# Patient Record
Sex: Female | Born: 1990 | Hispanic: No | Marital: Married | Smoking: Never smoker
Health system: Southern US, Community
[De-identification: ages and names within clinical notes are randomized; demographics above are authoritative.]

---

## 2015-01-25 ENCOUNTER — Emergency Department (HOSPITAL_COMMUNITY)
Admission: EM | Admit: 2015-01-25 | Discharge: 2015-01-25 | Disposition: A | Discharge status: Home / Self Care | Payer: Medicaid Other | Source: Home / Self Care

## 2015-01-25 ENCOUNTER — Ambulatory Visit (INDEPENDENT_AMBULATORY_CARE_PROVIDER_SITE_OTHER): Payer: Medicaid Other | Admitting: Family Medicine

## 2015-01-25 VITALS — BP 111/65 | HR 88 | Temp 98.2°F | Ht 58.75 in | Wt 150.0 lb

## 2015-01-25 DIAGNOSIS — J4520 Mild intermittent asthma, uncomplicated: Secondary | ICD-10-CM | POA: Diagnosis not present

## 2015-01-25 DIAGNOSIS — Z23 Encounter for immunization: Secondary | ICD-10-CM | POA: Diagnosis present

## 2015-01-25 MED ORDER — ALBUTEROL SULFATE HFA 108 (90 BASE) MCG/ACT IN AERS: 2.0000 | INHALATION_SPRAY | Freq: Four times a day (QID) | RESPIRATORY_TRACT | Status: DC | PRN

## 2015-01-25 NOTE — Patient Instructions (Signed)

## 2015-01-25 NOTE — Progress Notes (Signed)
Win Khine interpreter utilized during today's visit.  [facility] New [facility] Visit  HPI:  [facility] today for a new patient appointment to establish general primary care, also to discuss asthma.  ROS: See HPI  Immigrant Social History: - Date arrived in [place]: Dec 16th - Country of origin: [place] - Location of refugee camp (if applicable), how long there, and what caused patient to leave home country?: [place], poor living conditions - Primary language: Burmese and mon  -Licensed conveyancerequires intepreter (essentially speaks no [place]) - Education: Highest level of education: 4th grade - Prior work: plantation/farming - Best contact name and number: self, [phone] - Tobacco/alcohol/drug use: none - Marriage Status: married - Sexual activity: yes - Were you beaten or tortured in your country or refugee camp?  no   Preventative Care History: -Seen at health department?: yes -records not yet available  Past Medical Hx:  -asthma  Past Surgical Hx:  -none  Family Hx: updated in Epic - Number of family members:  Parents, siblings - Number of family members in [place]:  2  PHYSICAL EXAM: BP 111/65 mmHg  Pulse 88  Temp(Src) 98.2 F (36.8 C) (Oral)  Ht 4' 10.75" (1.492 m)  Wt 150 lb (68.04 kg)  BMI 30.57 kg/m2 Gen: well-appearing young woman HEENT: MMM, NCAT, PERRL, TMs clear Neck:  supple Heart: RRR, no MRG Lungs: CTAB, normal WOB Abdomen: soft, mildly tender in lower quadrants, no rebound or guarding Skin:  No rashes MSK: normal gait and ROM Neuro: alert and oriented, no focal deficits noted Psych: normal affect and behavior  Examined and interviewed with Dr. Gwendolyn GrantWalden  FOLLOW UP: F/u in 1 month for abdominal pain

## 2015-01-26 ENCOUNTER — Encounter: Payer: Self-pay | Admitting: Family Medicine

## 2015-01-26 NOTE — Assessment & Plan Note (Signed)
Chest tightness and SOB since childhood, better since age 24, never used meds besides cough syrup, still with nighttime cough and occasional dyspnea/chest tightness - MDI teaching with pharmacist today - start prn albuterol - f/u in 1 month

## 2015-02-04 ENCOUNTER — Encounter: Payer: Self-pay | Admitting: Family Medicine

## 2015-02-04 NOTE — Progress Notes (Signed)
[facility] positive for Entamoeba Polecki.  Contacted [facility] -- patient without symptoms and parasite deemed to be non-pathogenic and therefore no need for treatment.  Placed contact info from HD in "to be scanned" file.

## 2015-03-24 ENCOUNTER — Ambulatory Visit (INDEPENDENT_AMBULATORY_CARE_PROVIDER_SITE_OTHER): Payer: Medicaid Other | Admitting: Family Medicine

## 2015-03-24 VITALS — BP 109/78 | HR 75 | Temp 98.7°F | Wt 145.1 lb

## 2015-03-24 DIAGNOSIS — R200 Anesthesia of skin: Secondary | ICD-10-CM

## 2015-03-24 DIAGNOSIS — R208 Other disturbances of skin sensation: Secondary | ICD-10-CM | POA: Diagnosis not present

## 2015-03-24 DIAGNOSIS — R300 Dysuria: Secondary | ICD-10-CM

## 2015-03-24 DIAGNOSIS — R29898 Other symptoms and signs involving the musculoskeletal system: Secondary | ICD-10-CM

## 2015-03-24 LAB — POCT URINALYSIS DIPSTICK
Bilirubin, UA: NEGATIVE
Glucose, UA: NEGATIVE
Ketones, UA: NEGATIVE
LEUKOCYTES UA: NEGATIVE
Nitrite, UA: NEGATIVE
PROTEIN UA: NEGATIVE
Spec Grav, UA: 1.025
UROBILINOGEN UA: 0.2
pH, UA: 6.5

## 2015-03-24 LAB — POCT UA - MICROSCOPIC ONLY

## 2015-03-24 NOTE — Patient Instructions (Addendum)
We will get an MRI (image) of your head and neck to look for any signs of neurological disease.   In the meantime you can try to take any over the counter medicines to help with the small amount of pain you are having, tylenol 500mg  every 6 hours or ibuprofen 600mg  every 6 hours.  Work on grip strength exercises throughout the day as demonstrated in our visit.  Schedule a follow up visit 1-2 weeks after your MRI

## 2015-03-24 NOTE — Assessment & Plan Note (Signed)
2 weeks symptoms, has hx of UTI in [place]. Negative UA in clinic today. Unable to do wet prep as sx may be due to yeast with c/o itching as well. F/u next few weeks for GU exam.

## 2015-03-24 NOTE — Assessment & Plan Note (Signed)
Concerning onset and symptoms for neurological issue. On exam pt demonstrates weakness of both UE to the point of dropping her whole upper arm during testing. No LE weakness, no recent vaccinations, no other neurologic deficits noted. Case discussed with Dr. Randolm Idol, we deem it warranted to get MRI brain/c-spine to evaluate for neurological disease like MS, other demyelinating dz. F/u after MRI

## 2015-03-24 NOTE — Progress Notes (Signed)
   Subjective:    Patient ID: Joyce Nichols, female    DOB: 1991/02/27, 24 y.o.   MRN: [id number]  HPI  Patient presents for Same Day Appointment  CC: upper arm numbness and weakness  # Bilateral UE numbness/weakness:  Started 1 month ago  No history of similar complaints, no history of trauma  Both arms feel numb and weak, right side symptoms worse  Primarily feels numbness, but also has pain. Does not state in specific areas of the arm/hand, goes from shoulders down to fingertips. After some discussion sounds like it affects palmar surface of hands moreso, all fingers.  No recent changes that she can think of, no new medicines, no changes in activity  Symptoms causing significant issue with driving ROS: no changes in vision, no changes in hearing, no HA, no trouble swallowing, no CP, no SOB, no fevers/chills. +burning with urination  # Burning with urination  Present for the past few weeks, no increased frequency  Having itching as well  No abdominal pain  Review of Systems   See HPI for ROS. All other systems reviewed and are negative.  Past medical history, surgical, family, and social history reviewed and updated in the EMR as appropriate.  Objective:  BP 109/78 mmHg  Pulse 75  Temp(Src) 98.7 F (37.1 C) (Oral)  Wt 145 lb 2 oz (65.828 kg)  LMP 02/04/2015 (Approximate) Vitals and nursing note reviewed  General: NAD Neck: no tenderness to palpation. FROM with no pain.  Ext: no edema or deformity noted Skin: no rashes or lesions noted Neuro: alert and oriented. CN2-12 normal. Grip strength 5/5 bilaterally. Finger abduction/adduction strength testing 4/5 bilaterally, wrist flexion/extension 4/5 bilaterally. Pt lets hands and arms go limp into her lap between testing. 2-3+ biceps reflexes bilaterally, 2+ triceps, 2+ brachioradialis, 2+ patellar bilaterally. Phalen's positive for symptoms both sides but not specifically median distribution, Tinel's same, positive for  tingling all fingers bilaterally. Psych: endorses depression x 1 month since symptoms started, no other complaints prior  Assessment & Plan:  See Problem List Documentation

## 2015-04-02 ENCOUNTER — Ambulatory Visit (HOSPITAL_COMMUNITY): Admission: RE | Admit: 2015-04-02 | Payer: Medicaid Other | Source: Ambulatory Visit

## 2015-04-02 ENCOUNTER — Ambulatory Visit (HOSPITAL_COMMUNITY)
Admission: RE | Admit: 2015-04-02 | Discharge: 2015-04-02 | Disposition: A | Discharge status: Home / Self Care | Payer: Medicaid Other | Source: Ambulatory Visit | Attending: Family Medicine | Admitting: Family Medicine

## 2015-04-02 DIAGNOSIS — R531 Weakness: Secondary | ICD-10-CM | POA: Diagnosis not present

## 2015-04-02 DIAGNOSIS — R200 Anesthesia of skin: Secondary | ICD-10-CM | POA: Diagnosis not present

## 2015-04-02 DIAGNOSIS — R29898 Other symptoms and signs involving the musculoskeletal system: Secondary | ICD-10-CM

## 2015-04-07 ENCOUNTER — Encounter: Payer: Self-pay | Admitting: Family Medicine

## 2015-04-07 ENCOUNTER — Ambulatory Visit (INDEPENDENT_AMBULATORY_CARE_PROVIDER_SITE_OTHER): Payer: Medicaid Other | Admitting: Family Medicine

## 2015-04-07 VITALS — BP 104/63 | HR 63 | Temp 98.2°F | Ht 59.0 in | Wt 144.0 lb

## 2015-04-07 DIAGNOSIS — R202 Paresthesia of skin: Secondary | ICD-10-CM

## 2015-04-07 DIAGNOSIS — R29898 Other symptoms and signs involving the musculoskeletal system: Secondary | ICD-10-CM | POA: Diagnosis not present

## 2015-04-07 DIAGNOSIS — R200 Anesthesia of skin: Secondary | ICD-10-CM

## 2015-04-07 LAB — BASIC METABOLIC PANEL WITH GFR
BUN: 8 mg/dL (ref 6–23)
CO2: 23 mEq/L (ref 19–32)
Calcium: 9 mg/dL (ref 8.4–10.5)
Chloride: 105 mEq/L (ref 96–112)
Creat: 0.56 mg/dL (ref 0.50–1.10)
GFR, Est African American: >89 mL/min
GFR, Est Non African American: >89 mL/min
Glucose, Bld: 79 mg/dL (ref 70–99)
Potassium: 4.1 mEq/L (ref 3.5–5.3)
Sodium: 141 mEq/L (ref 135–145)

## 2015-04-07 LAB — CBC
HCT: 39.1 % (ref 36.0–46.0)
Hemoglobin: 12.9 g/dL (ref 12.0–15.0)
MCH: 29.4 pg (ref 26.0–34.0)
MCHC: 33 g/dL (ref 30.0–36.0)
MCV: 89.1 fL (ref 78.0–100.0)
MPV: 10.3 fL (ref 8.6–12.4)
Platelets: 273 10*3/uL (ref 150–400)
RBC: 4.39 MIL/uL (ref 3.87–5.11)
RDW: 13.6 % (ref 11.5–15.5)
WBC: 7.5 10*3/uL (ref 4.0–10.5)

## 2015-04-07 NOTE — Assessment & Plan Note (Signed)
MRI c-spine/brain were normal. Pt today reports symptoms are entirely on the right (different from last visit where she stated she was having issues with the left as well). No specific isolated nerve distribution by history. Discussed NCS and pt desires to get this. Will also check CBC and Bmet. F/u after NCS (~6 weeks).

## 2015-04-07 NOTE — Patient Instructions (Addendum)
We will check your blood work for signs of anemia (low red blood cell count).  We will get a nerve conduction test to look at the nerves of the right arm. This is done at another doctor's office (neurologist)

## 2015-04-07 NOTE — Progress Notes (Signed)
   Subjective:    Patient ID: Joyce Nichols, female    DOB: 08/20/1991, 24 y.o.   MRN: [id number]  HPI  CC: arm weakness/numbness  # Upper extremity weakness/numbness:  Had normal MRI c-spine and brain (did show possible evidence of anemia)  Pt symptoms have stayed the same  Today says it is entirely her right arm/hand that is affected, says her left side is completely normal  Feels weak of the hand and arm  Numbness covers all of the fingers, both palm and back of hand  Review of Systems   See HPI for ROS. All other systems reviewed and are negative.  Past medical history, surgical, family, and social history reviewed and updated in the EMR as appropriate. Objective:  BP 104/63 mmHg  Pulse 63  Temp(Src) 98.2 F (36.8 C) (Oral)  Ht 4\' 11"  (1.499 m)  Wt 144 lb (65.318 kg)  BMI 29.07 kg/m2  LMP 02/04/2015 (Approximate) Vitals and nursing note reviewed  General: NAD Neuro: alert and oriented x 3. Strength testing: 5/5 grip biceps, triceps bilaterally. Phalen's and tinel's are equivocal (says right hand/fingers are numb but not made worse with testing) MSK: no deformity of right UE, fingers are normal in appearance.   Assessment & Plan:  See Problem List Documentation

## 2015-04-14 ENCOUNTER — Ambulatory Visit (INDEPENDENT_AMBULATORY_CARE_PROVIDER_SITE_OTHER): Payer: Medicaid Other | Admitting: Family Medicine

## 2015-04-14 ENCOUNTER — Encounter: Payer: Self-pay | Admitting: Family Medicine

## 2015-04-14 VITALS — BP 106/65 | HR 84 | Temp 98.7°F | Ht 59.0 in | Wt 144.6 lb

## 2015-04-14 DIAGNOSIS — R29898 Other symptoms and signs involving the musculoskeletal system: Secondary | ICD-10-CM

## 2015-04-14 DIAGNOSIS — H5310 Unspecified subjective visual disturbances: Secondary | ICD-10-CM

## 2015-04-14 DIAGNOSIS — H53143 Visual discomfort, bilateral: Secondary | ICD-10-CM | POA: Diagnosis not present

## 2015-04-14 NOTE — Assessment & Plan Note (Signed)
Following up with Neurology next week for nerve conduction study

## 2015-04-14 NOTE — Patient Instructions (Signed)
Thank you so much for coming to visit me today!  Unfortunately, I cannot get you in to see an eye doctor with a referral. I recommend [facility].  Please let us know if there is anything else we can do for your!  Please keep your appointment with the Neurologist next week.  Thanks again! Dr. Caroleen Hammanumley

## 2015-04-14 NOTE — Assessment & Plan Note (Signed)
-   Suspect pain is due to eye fatigue that occurs when focusing during driving or reading - Eye pain stable x2 years and only occurs with reading/driving. Do not suspect emergent etiology at this time. - Eye exam normal, vision screen normal - Will not refer to ophthalmology at this time. Recommended going to eye clinic in [facility] for more in depth evaluation of sight.

## 2015-04-14 NOTE — Progress Notes (Signed)
Subjective:     Patient ID: Joyce Nichols, female   DOB: 09/21/1991, 24 y.o.   MRN: [id number]  HPI Mrs. Joyce Nichols is a 24yo female presenting today for eye pain. Initially made visit to evaluate vaginal itching, however she states this has resolved. Visit conducted with aid of interpretor.  # Vaginal Itching: - Resolved x1 week - Denies discharge, vaginal pain, abdominal pain, vaginal bleeding - Up to date on Pap Smear. [facility].  # Arm Numbness: - Follows up with Neurology next week for nerve conduction study - Unchanged from last visit  # Eye Pain: - Occurs when driving or reading - Bilateral - States it feels like someone is squeezing her eyeball - Denies blurred vision, redness, itchiness, discharge, photophobia - Occasionally reports watery eyes - Has been stable x2 years - Has never had this evaluated by PCP or ophthalmologist - Would like to be seen by ophthalmologist if possible  Review of Systems  HENT: Negative for sinus pressure.   Eyes: Positive for pain. Negative for photophobia, discharge, redness, itching and visual disturbance.  Genitourinary: Negative for dysuria, frequency, flank pain, vaginal bleeding, vaginal discharge, vaginal pain and pelvic pain.  Neurological: Positive for numbness.       Objective:   Physical Exam  Constitutional: She appears well-developed and well-nourished. No distress.  Eyes: Conjunctivae and EOM are normal. Pupils are equal, round, and reactive to light. Right eye exhibits no discharge. Left eye exhibits no discharge.  No redness noted. Ophthalmologic exam normal.  Cardiovascular: Normal rate and regular rhythm.  Exam reveals no gallop and no friction rub.   No murmur heard. Pulmonary/Chest: Effort normal. No respiratory distress. She has no wheezes. She has no rales.  Abdominal: Soft. She exhibits no distension. There is no tenderness.  Musculoskeletal: She exhibits no edema.       Assessment:     Please refer to Problem List for Assessment.     Plan:     Please refer to Problem List for Plan.

## 2015-04-20 ENCOUNTER — Encounter: Payer: Self-pay | Admitting: Neurology

## 2015-04-20 ENCOUNTER — Ambulatory Visit (INDEPENDENT_AMBULATORY_CARE_PROVIDER_SITE_OTHER): Payer: Medicaid Other | Admitting: Neurology

## 2015-04-20 ENCOUNTER — Ambulatory Visit (INDEPENDENT_AMBULATORY_CARE_PROVIDER_SITE_OTHER): Payer: Self-pay | Admitting: Neurology

## 2015-04-20 DIAGNOSIS — R29898 Other symptoms and signs involving the musculoskeletal system: Secondary | ICD-10-CM

## 2015-04-20 DIAGNOSIS — R202 Paresthesia of skin: Secondary | ICD-10-CM | POA: Diagnosis not present

## 2015-04-20 DIAGNOSIS — R200 Anesthesia of skin: Secondary | ICD-10-CM

## 2015-04-20 NOTE — Progress Notes (Signed)
Please refer to EMG and nerve conduction study procedure note. 

## 2015-04-20 NOTE — Procedures (Signed)
     HISTORY:  Joyce Nichols is a 24 year old Asian female with a two-month history of numbness and some weakness involving the right hand. She is being evaluated for possible neuropathy or a cervical radiculopathy. She denies any neck or shoulder discomfort.  NERVE CONDUCTION STUDIES:  Nerve conduction studies were performed on both upper extremities. The distal motor latencies and motor amplitudes for the median and ulnar nerves were within normal limits. The F wave latencies and nerve conduction velocities for these nerves were also normal. The sensory latencies for the median and ulnar nerves were normal.   EMG STUDIES:  EMG study was performed on the right upper extremity:  The first dorsal interosseous muscle reveals 2 to 4 K units with full recruitment. No fibrillations or positive waves were noted. The abductor pollicis brevis muscle reveals 2 to 4 K units with full recruitment. No fibrillations or positive waves were noted. The extensor indicis proprius muscle reveals 1 to 3 K units with full recruitment. No fibrillations or positive waves were noted. The pronator teres muscle reveals 2 to 3 K units with full recruitment. No fibrillations or positive waves were noted. The biceps muscle reveals 1 to 2 K units with full recruitment. No fibrillations or positive waves were noted. The triceps muscle reveals 2 to 4 K units with full recruitment. No fibrillations or positive waves were noted. The anterior deltoid muscle reveals 2 to 3 K units with full recruitment. No fibrillations or positive waves were noted.  The patient refused evaluation of the cervical paraspinal muscles.   IMPRESSION:  Nerve conduction studies done on both upper extremities were within normal limits. No evidence of a neuropathy is seen. EMG evaluation of the right upper extremity was unremarkable, no evidence of an overlying cervical radiculopathy was seen.  Marlan Palau. Keith Willis MD 04/20/2015 1:11 PM  [facility] [address] [place], [place] [id number]  Phone [phone] Fax [phone]

## 2015-06-24 ENCOUNTER — Encounter: Payer: Self-pay | Admitting: Family Medicine

## 2015-06-24 ENCOUNTER — Ambulatory Visit (INDEPENDENT_AMBULATORY_CARE_PROVIDER_SITE_OTHER): Payer: Medicaid Other | Admitting: Family Medicine

## 2015-06-24 VITALS — BP 115/69 | HR 78 | Temp 98.3°F | Wt 145.3 lb

## 2015-06-24 DIAGNOSIS — M545 Low back pain, unspecified: Secondary | ICD-10-CM | POA: Insufficient documentation

## 2015-06-24 DIAGNOSIS — Z23 Encounter for immunization: Secondary | ICD-10-CM | POA: Diagnosis not present

## 2015-06-24 MED ORDER — CYCLOBENZAPRINE HCL 5 MG PO TABS: 5.0000 mg | ORAL_TABLET | Freq: Three times a day (TID) | ORAL | Status: DC | PRN

## 2015-06-24 MED ORDER — MELOXICAM 7.5 MG PO TABS: 7.5000 mg | ORAL_TABLET | Freq: Every day | ORAL | Status: DC

## 2015-06-24 NOTE — Patient Instructions (Signed)
Thank you for coming to the clinic today. It was nice seeing you.  You probably have a muscle strain of your lower back. This usually improves in 4-6 weeks. We will give you two medications today. Please take the Mobic once a day for the next 2 weeks. Please use the Flexeril as needed for back pain.  Please return to the clinic in 2-3 weeks if your symptoms are worsening or not improving.  Take Care,  Dr Jimmey Ralph  Back Pain, Adult Back pain is very common. The pain often gets better over time. The cause of back pain is usually not dangerous. Most people can learn to manage their back pain on their own.  HOME CARE   Stay active. Start with short walks on flat ground if you can. Try to walk farther each day.  Do not sit, drive, or stand in one place for more than 30 minutes. Do not stay in bed.  Do not avoid exercise or work. Activity can help your back heal faster.  Be careful when you bend or lift an object. Bend at your knees, keep the object close to you, and do not twist.  Sleep on a firm mattress. Lie on your side, and bend your knees. If you lie on your back, put a pillow under your knees.  Only take medicines as told by your doctor.  Put ice on the injured area.  Put ice in a plastic bag.  Place a towel between your skin and the bag.  Leave the ice on for 15-20 minutes, 03-04 times a day for the first 2 to 3 days. After that, you can switch between ice and heat packs.  Ask your doctor about back exercises or massage.  Avoid feeling anxious or stressed. Find good ways to deal with stress, such as exercise. GET HELP RIGHT AWAY IF:   Your pain does not go away with rest or medicine.  Your pain does not go away in 1 week.  You have new problems.  You do not feel well.  The pain spreads into your legs.  You cannot control when you poop (bowel movement) or pee (urinate).  Your arms or legs feel weak or lose feeling (numbness).  You feel sick to your stomach  (nauseous) or throw up (vomit).  You have belly (abdominal) pain.  You feel like you may pass out (faint). MAKE SURE YOU:   Understand these instructions.  Will watch your condition.  Will get help right away if you are not doing well or get worse. Document Released: 03/06/2008 Document Revised: 12/11/2011 Document Reviewed: 01/20/2014 [facility] Patient Information 2015 [place], [place]. This information is not intended to replace advice given to you by your health care provider. Make sure you discuss any questions you have with your health care provider.

## 2015-06-24 NOTE — Progress Notes (Signed)
    Subjective:  Joyce Nichols is a 24 y.o. female who presents to the [facility] today with a chief complaint of back pain.   HPI:  Back Pain Patient reports acute onset back pain for the past month. No known injuries. Patient denies strenuous exercise. No other known precipitating events. Pain is mostly located in the middle portion of her low back, which helped some. No radiation. Has tried putting balms on her back which has helped some. Had a similar episode 4-5 years ago which resolved spontaneously. No fevers or chills. No dysuria. No weakness or numbness. No bowel or bladder incontinence. No saddle anesthesia.   ROS: Per HPI  PMH:  The following were reviewed and entered/updated in epic: PMH: ASthma Patient Active Problem List   Diagnosis Date Noted  . Low back pain 06/24/2015  . Asthma, mild intermittent 01/25/2015  PSH: Noncontributory Meds: Nexplanon, albuterol FH: Noncontributory  Objective:  Physical Exam: BP 115/69 mmHg  Pulse 78  Temp(Src) 98.3 F (36.8 C) (Oral)  Wt 145 lb 5 oz (65.913 kg)  Gen: NAD, resting comfortably CV: RRR with no murmurs appreciated Lungs: NWOB, CTAB with no crackles, wheezes, or rhonchi GI: Normal bowel sounds present. Soft, Nontender, Nondistended. MSK:  -Back: Midline tenderness along thoracic and lumbar spine. No step-offs or deformities. Paraspinal muscles in lumbar region tight feeling. Pain worsened by leaning forward. FROM. No CVA tenderness.  -LE: 5/5 strength, sensation to light touch grossly intact.  Skin: warm, dry Neuro: grossly normal, moves all extremities Psych: Normal affect and thought content  Assessment/Plan:  Low back pain Patient with 4 weeks of low back pain consistent with muscular strain. No red flag signs or symptoms. No signs or symptoms of UTI or nephrolithasis. Will treat with 2 week course of mobic. Will also prescribe flexeril for use as needed for muscle spasms. Instructed patient to stay mobile, but avoid  activities that worsen her pain.   Will follow up in 2 weeks if not improving. May consider plain films at that time.     Katina Degree. Jimmey Ralph, MD [facility] Family Medicine Resident PGY-2 06/24/2015 3:24 PM

## 2015-06-24 NOTE — Assessment & Plan Note (Addendum)
Patient with 4 weeks of low back pain consistent with muscular strain. No red flag signs or symptoms. No signs or symptoms of UTI or nephrolithasis. Will treat with 2 week course of mobic. Will also prescribe flexeril for use as needed for muscle spasms. Instructed patient to stay mobile, but avoid activities that worsen her pain.   Will follow up in 2 weeks if not improving. May consider plain films at that time.

## 2015-10-20 ENCOUNTER — Encounter: Payer: Self-pay | Admitting: *Deleted

## 2015-10-20 DIAGNOSIS — J4530 Mild persistent asthma, uncomplicated: Secondary | ICD-10-CM

## 2015-10-20 NOTE — Congregational Nurse Program (Signed)
Congregational Nurse Program Note  Date of Encounter: 10/20/2015  Past Medical History: No past medical history on file.  Encounter Details:     CNP Questionnaire - 10/20/15 1902    Patient Demographics   Is this a new or existing patient? Existing   Race Asian   Patient Assistance   Location of Patient Assistance [place]   Patient's financial/insurance status Medicaid   Uninsured Patient No   Patient referred to apply for the following financial assistance Not Applicable   Food insecurities addressed Not Applicable   Transportation assistance No      Client came to get assistance with filling her medication and this assistance was given 10/19/2015

## 2017-05-19 IMAGING — MR MR CERVICAL SPINE W/O CM
13 of 18 series · 25 of 48 positions shown · IV contrast (Yes)
Comparison: None.

CLINICAL DATA: Numbness in the right upper and lower extremity for
2 months. Upper extremity weakness.

EXAM:
MRI HEAD WITHOUT CONTRAST
MRI CERVICAL SPINE WITHOUT CONTRAST
TECHNIQUE: Multiplanar, multiecho pulse sequences of the brain and surrounding
structures, and cervical spine, to include the craniocervical
junction and cervicothoracic junction, were obtained without
intravenous contrast.

[Series 2: FLAIR · sagittal · 5.0mm · 0.47mm/px · 2 of 23 slices shown (1 of 4)]
[im 1/23]
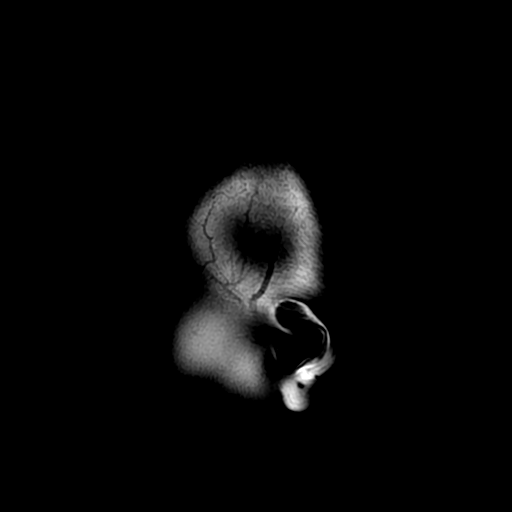
[im 23/23]
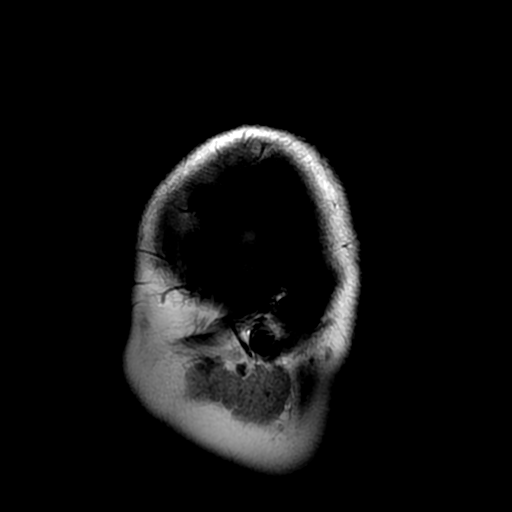

[Series 4: DWI · axial · 3.0mm · 0.94mm/px · z∈[-56,+75]mm · 5 of 90 slices shown (1 of 2)]
[im 1/90]
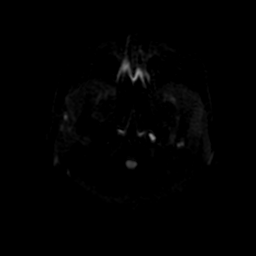
[im 23/90]
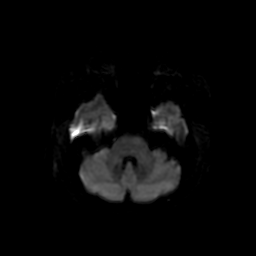
[im 45/90]
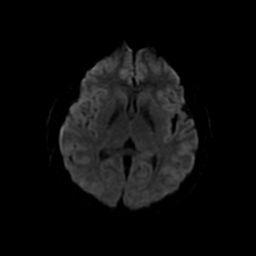
[im 67/90]
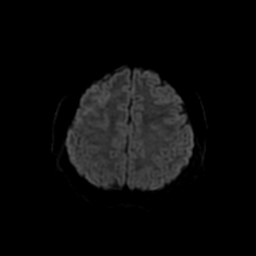
[im 90/90]
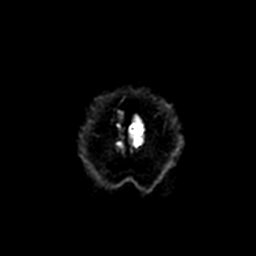

[Series 5: T2 · axial · 5.0mm · 0.47mm/px · 1 of 25 slices shown (1 of 4)]
[im 1/25]
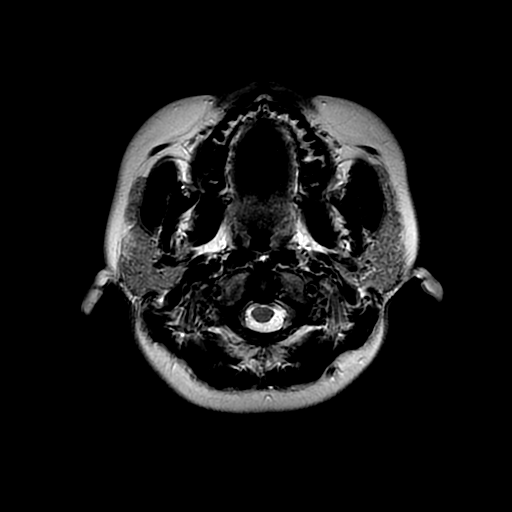

[Series 6: FLAIR · axial · 5.0mm · 0.47mm/px · 1 of 25 slices shown (2 of 4)]
[im 1/25]
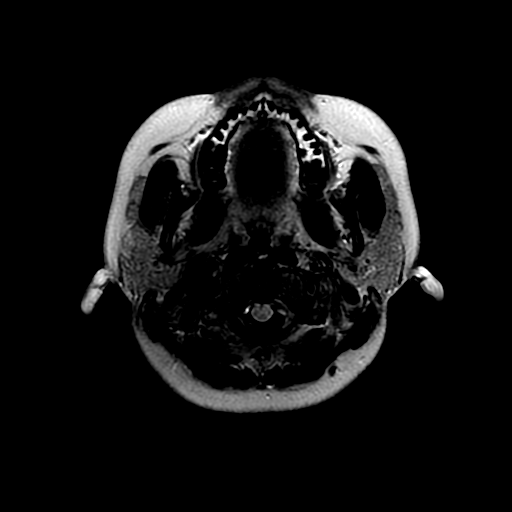

[Series 7: DWI · coronal · 5.0mm · 0.94mm/px · 3 of 62 slices shown (2 of 2)]
[im 1/62]
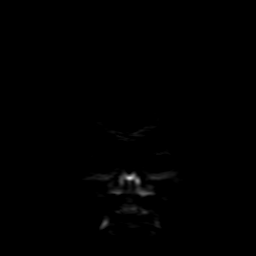
[im 31/62]
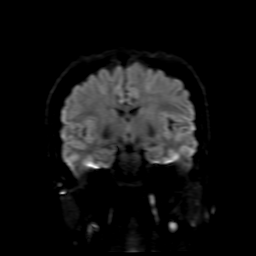
[im 62/62]
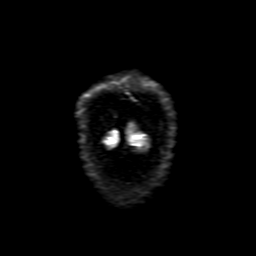

[Series 8: (person_name) · axial · 3.0mm · 0.47mm/px · z∈[-56,+92]mm · 5 of 100 slices shown]
[im 1/100]
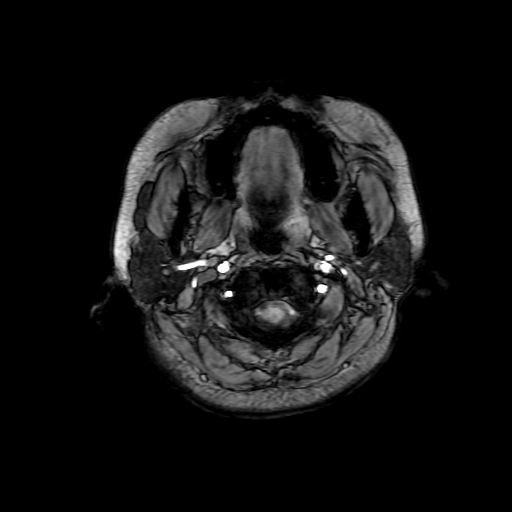
[im 25/100]
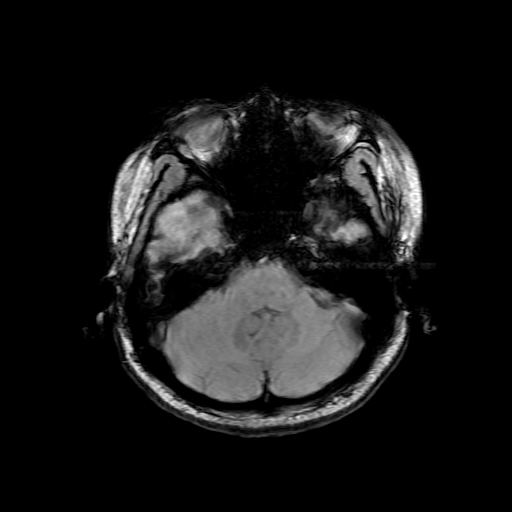
[im 50/100]
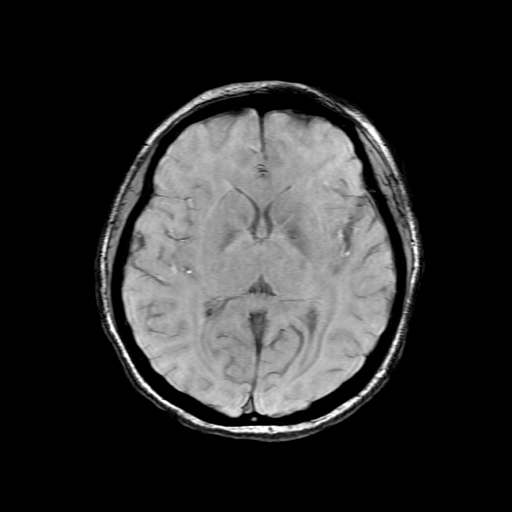
[im 75/100]
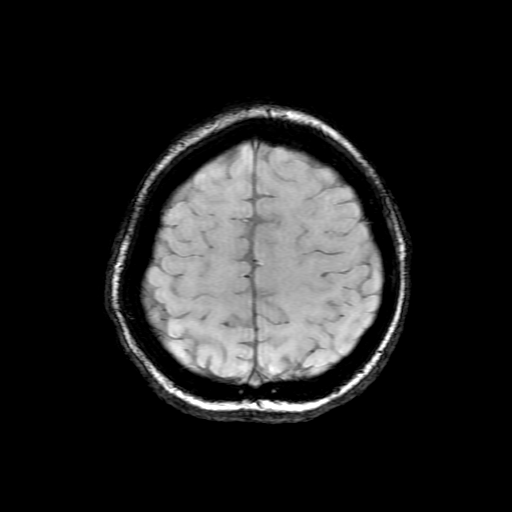
[im 100/100]
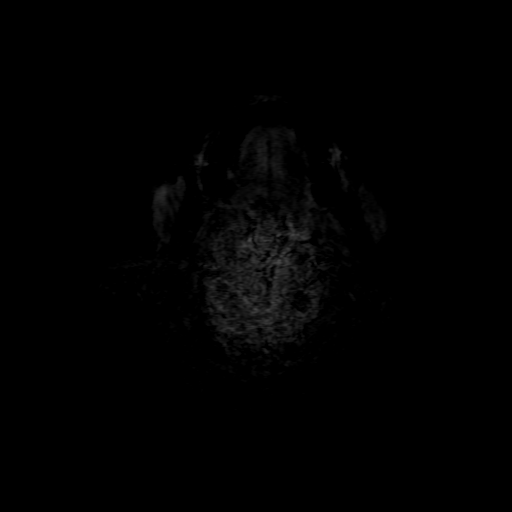

[Series 10: GRE · axial · 5.0mm · 0.47mm/px · 1 of 28 slices shown]
[im 1/28]
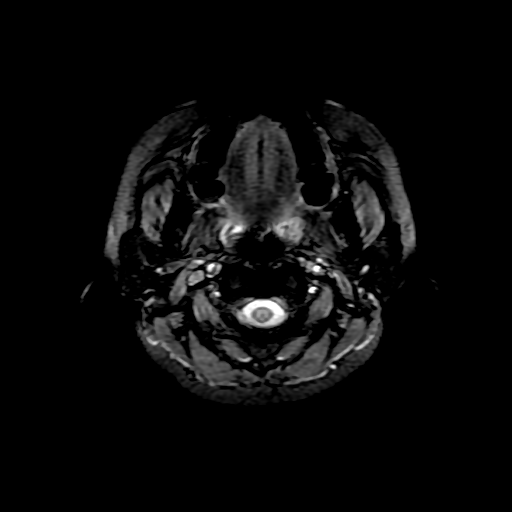

[Series 11: T2 · coronal · 5.0mm · 0.39mm/px · 1 of 25 slices shown (2 of 4)]
[im 1/25]
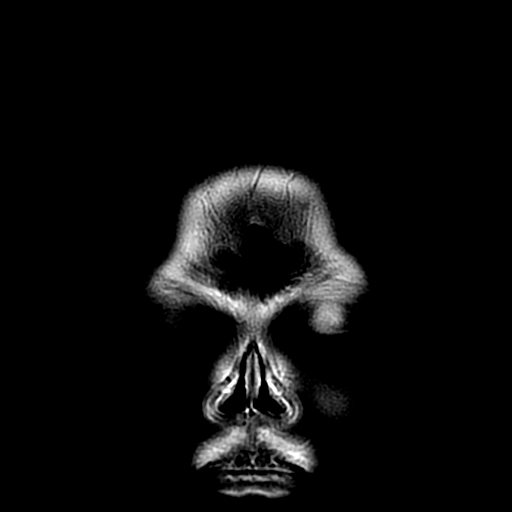

[Series 13: T2 · sagittal · 3.0mm · 0.43mm/px · 1 of 14 slices shown (3 of 4)]
[im 1/14]
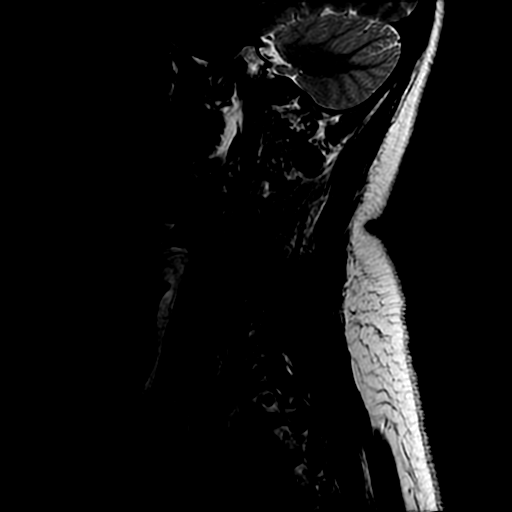

[Series 14: FLAIR · sagittal · 3.0mm · 0.43mm/px · 1 of 14 slices shown (3 of 4)]
[im 1/14]
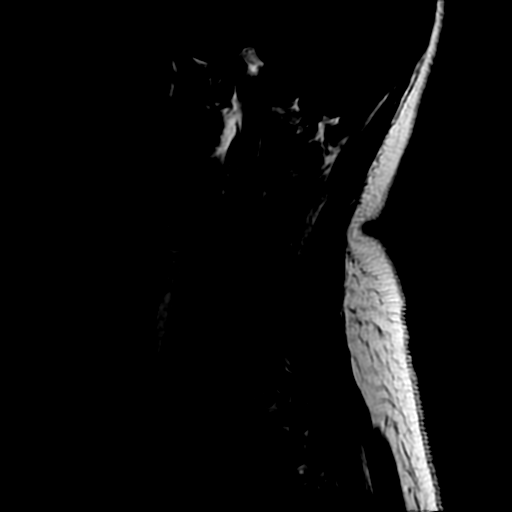

[Series 15: STIR · sagittal · 3.0mm · 0.86mm/px · 1 of 14 slices shown]
[im 1/14]
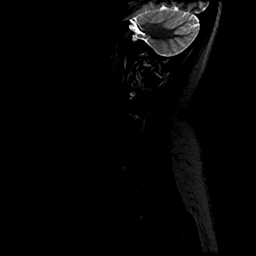

[Series 17: T2 · axial · 3.0mm · 0.35mm/px · z∈[-178,-61]mm · 2 of 36 slices shown (4 of 4)]
[im 1/36]
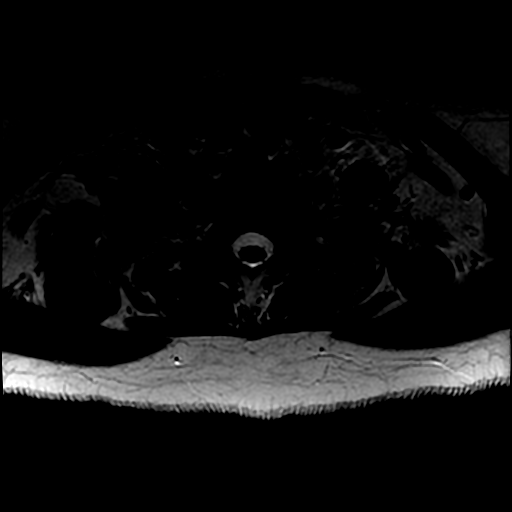
[im 36/36]
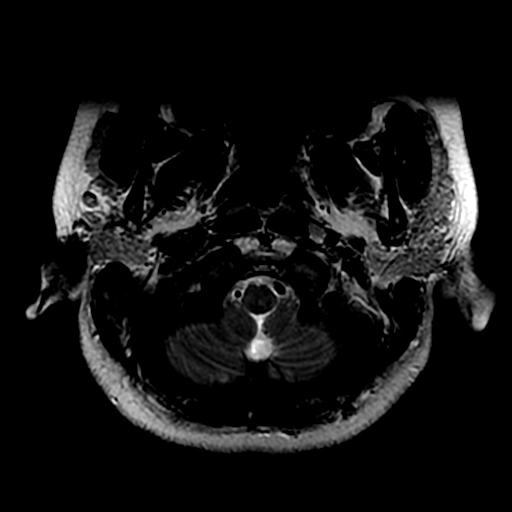

[Series 18: FLAIR · sagittal · 1.6mm · 0.49mm/px · 1 of 184 slices shown (4 of 4)]
[im 1/184]
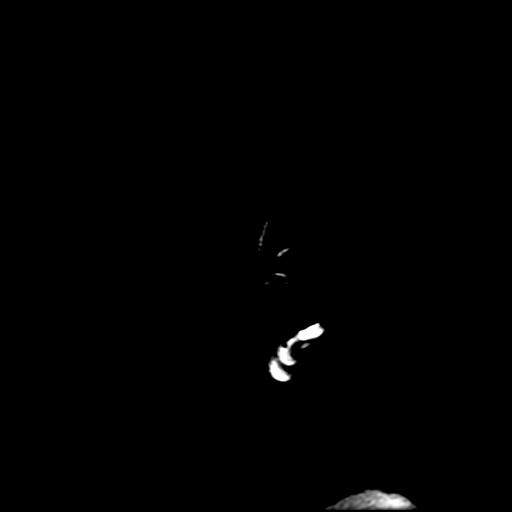

[25 of 48 positions shown; findings below may reference images not displayed]

FINDINGS: MRI HEAD FINDINGS

No acute infarct, hemorrhage, or mass lesion is present. The
ventricles are of normal size. No significant extraaxial fluid
collection is present.

Flow is present in the major intracranial arteries. The globes and
orbits are intact.

The paranasal sinuses and mastoid air cells are clear.

No significant white matter disease is present. The corpus callosum
is unremarkable.

Skullbase is within normal limits. Midline structures are
unremarkable.

MRI CERVICAL SPINE FINDINGS

Normal signal is present in the cervical and upper thoracic spinal
cord to the lowest imaged level, T3-4. Marrow signal is somewhat
depressed throughout. No focal osseous lesions are evident.

Flow is present in the vertebral arteries. The craniocervical
junction is within normal limits.

No significant disc protrusion or stenosis is present within the
cervical spine.
IMPRESSION: 1. Normal MRI appearance the brain. No discrete white matter lesion
is present to suggest a demyelinating process.
2. Normal MRI appearance of the cervical spinal cord.
3. Diffusely decreased T1 marrow signal. This could be related to
anemia or chronic hypoxia.

## 2019-12-31 ENCOUNTER — Encounter: Payer: Self-pay | Admitting: Advanced Practice Midwife

## 2019-12-31 ENCOUNTER — Ambulatory Visit (INDEPENDENT_AMBULATORY_CARE_PROVIDER_SITE_OTHER): Payer: Medicaid Other | Admitting: Advanced Practice Midwife

## 2019-12-31 ENCOUNTER — Other Ambulatory Visit: Payer: Self-pay

## 2019-12-31 VITALS — BP 115/74 | HR 72 | Wt 142.2 lb

## 2019-12-31 DIAGNOSIS — Z30011 Encounter for initial prescription of contraceptive pills: Secondary | ICD-10-CM

## 2019-12-31 DIAGNOSIS — Z3046 Encounter for surveillance of implantable subdermal contraceptive: Secondary | ICD-10-CM | POA: Diagnosis not present

## 2019-12-31 MED ORDER — NORGESTIMATE-ETH ESTRADIOL 0.25-35 MG-MCG PO TABS: 1.0000 | ORAL_TABLET | Freq: Every day | ORAL | 11 refills | Status: DC

## 2019-12-31 NOTE — Progress Notes (Signed)
Need pap Last pap 2016

## 2019-12-31 NOTE — Progress Notes (Signed)
     GYNECOLOGY OFFICE PROCEDURE NOTE  Joyce Nichols is a 29 y.o. No obstetric history on file. here for Nexplanon removal.  Last pap smear was in 2016 and was normal.  No other gynecologic concerns.  Nexplanon Removal Patient identified, informed consent performed, consent signed.   Appropriate time out taken. Nexplanon site identified.  Area prepped in usual sterile fashon. One ml of 1% lidocaine was used to anesthetize the area at the distal end of the implant. A small stab incision was made right beside the implant on the distal portion.  The Nexplanon rod was grasped using hemostats and removed without difficulty.  There was minimal blood loss. There were no complications.  3 ml of 1% lidocaine was injected around the incision for post-procedure analgesia.  Steri-strips were applied over the small incision.  A pressure bandage was applied to reduce any bruising.  The patient tolerated the procedure well and was given post procedure instructions.  Patient is planning to use OCPs for contraception.  Language barrier: Burmese interpreter utilized for all patient interaction  RTC at earliest convenience for pap and well woman lab work.  Clayton Bibles, MSN, CNM Certified Nurse Midwife, [facility] for [employer], [facility] 12/31/19 8:57 PM

## 2020-01-19 ENCOUNTER — Ambulatory Visit (INDEPENDENT_AMBULATORY_CARE_PROVIDER_SITE_OTHER): Payer: Medicaid Other | Admitting: Obstetrics & Gynecology

## 2020-01-19 ENCOUNTER — Other Ambulatory Visit (HOSPITAL_COMMUNITY)
Admission: RE | Admit: 2020-01-19 | Discharge: 2020-01-19 | Disposition: A | Discharge status: Home / Self Care | Payer: Medicaid Other | Source: Ambulatory Visit | Attending: Obstetrics & Gynecology | Admitting: Obstetrics & Gynecology

## 2020-01-19 ENCOUNTER — Encounter: Payer: Self-pay | Admitting: Obstetrics & Gynecology

## 2020-01-19 ENCOUNTER — Other Ambulatory Visit: Payer: Self-pay

## 2020-01-19 VITALS — BP 107/70 | HR 65 | Wt 148.0 lb

## 2020-01-19 DIAGNOSIS — Z01419 Encounter for gynecological examination (general) (routine) without abnormal findings: Secondary | ICD-10-CM | POA: Insufficient documentation

## 2020-01-19 NOTE — Patient Instructions (Signed)
Preventive Care 21-30 Years Old, Female Preventive care refers to visits with your health care provider and lifestyle choices that can promote health and wellness. This includes:  A yearly physical exam. This may also be called an annual well check.  Regular dental visits and eye exams.  Immunizations.  Screening for certain conditions.  Healthy lifestyle choices, such as eating a healthy diet, getting regular exercise, not using drugs or products that contain nicotine and tobacco, and limiting alcohol use. What can I expect for my preventive care visit? Physical exam Your health care provider will check your:  Height and weight. This may be used to calculate body mass index (BMI), which tells if you are at a healthy weight.  Heart rate and blood pressure.  Skin for abnormal spots. Counseling Your health care provider may ask you questions about your:  Alcohol, tobacco, and drug use.  Emotional well-being.  Home and relationship well-being.  Sexual activity.  Eating habits.  Work and work environment.  Method of birth control.  Menstrual cycle.  Pregnancy history. What immunizations do I need?  Influenza (flu) vaccine  This is recommended every year. Tetanus, diphtheria, and pertussis (Tdap) vaccine  You may need a Td booster every 10 years. Varicella (chickenpox) vaccine  You may need this if you have not been vaccinated. Human papillomavirus (HPV) vaccine  If recommended by your health care provider, you may need three doses over 6 months. Measles, mumps, and rubella (MMR) vaccine  You may need at least one dose of MMR. You may also need a second dose. Meningococcal conjugate (MenACWY) vaccine  One dose is recommended if you are age 19-21 years and a first-year college student living in a residence hall, or if you have one of several medical conditions. You may also need additional booster doses. Pneumococcal conjugate (PCV13) vaccine  You may need  this if you have certain conditions and were not previously vaccinated. Pneumococcal polysaccharide (PPSV23) vaccine  You may need one or two doses if you smoke cigarettes or if you have certain conditions. Hepatitis A vaccine  You may need this if you have certain conditions or if you travel or work in places where you may be exposed to hepatitis A. Hepatitis B vaccine  You may need this if you have certain conditions or if you travel or work in places where you may be exposed to hepatitis B. Haemophilus influenzae type b (Hib) vaccine  You may need this if you have certain conditions. You may receive vaccines as individual doses or as more than one vaccine together in one shot (combination vaccines). Talk with your health care provider about the risks and benefits of combination vaccines. What tests do I need?  Blood tests  Lipid and cholesterol levels. These may be checked every 5 years starting at age 20.  Hepatitis C test.  Hepatitis B test. Screening  Diabetes screening. This is done by checking your blood sugar (glucose) after you have not eaten for a while (fasting).  Sexually transmitted disease (STD) testing.  BRCA-related cancer screening. This may be done if you have a family history of breast, ovarian, tubal, or peritoneal cancers.  Pelvic exam and Pap test. This may be done every 3 years starting at age 21. Starting at age 30, this may be done every 5 years if you have a Pap test in combination with an HPV test. Talk with your health care provider about your test results, treatment options, and if necessary, the need for more tests.   Follow these instructions at home: Eating and drinking   Eat a diet that includes fresh fruits and vegetables, whole grains, lean protein, and low-fat dairy.  Take vitamin and mineral supplements as recommended by your health care provider.  Do not drink alcohol if: ? Your health care provider tells you not to drink. ? You are  pregnant, may be pregnant, or are planning to become pregnant.  If you drink alcohol: ? Limit how much you have to 0-1 drink a day. ? Be aware of how much alcohol is in your drink. In the [place] one drink equals one 12 oz bottle of beer (355 mL), one 5 oz glass of wine (148 mL), or one 1 oz glass of hard liquor (44 mL). Lifestyle  Take daily care of your teeth and gums.  Stay active. Exercise for at least 30 minutes on 5 or more days each week.  Do not use any products that contain nicotine or tobacco, such as cigarettes, e-cigarettes, and chewing tobacco. If you need help quitting, ask your health care provider.  If you are sexually active, practice safe sex. Use a condom or other form of birth control (contraception) in order to prevent pregnancy and STIs (sexually transmitted infections). If you plan to become pregnant, see your health care provider for a preconception visit. What's next?  Visit your health care provider once a year for a well check visit.  Ask your health care provider how often you should have your eyes and teeth checked.  Stay up to date on all vaccines. This information is not intended to replace advice given to you by your health care provider. Make sure you discuss any questions you have with your health care provider. Document Revised: 05/30/2018 Document Reviewed: 05/30/2018 Elsevier Patient Education  2020 [employer].

## 2020-01-19 NOTE — Progress Notes (Signed)
    GYNECOLOGY ANNUAL PREVENTATIVE CARE ENCOUNTER NOTE  History:     Joyce Nichols is a 29 y.o. G0 female here for a routine annual gynecologic exam.  Current complaints: none.   Denies abnormal vaginal bleeding, discharge, pelvic pain, problems with intercourse or other gynecologic concerns.  Patient is Burmese-speaking only, interpreter present for this encounter.    Obstetric/Gynecologic History No LMP recorded. (Menstrual status: Irregular Periods). Contraception: OCP (estrogen/progesterone) Last Pap: 2016. Results were: normal   History reviewed. No pertinent past medical history.  History reviewed. No pertinent surgical history.  Current Outpatient Medications on File Prior to Visit  Medication Sig Dispense Refill  . norgestimate-ethinyl estradiol (ORTHO-CYCLEN) 0.25-35 MG-MCG tablet Take 1 tablet by mouth daily. 1 Package 11   No current facility-administered medications on file prior to visit.    No Known Allergies  Social History:  reports that she has never smoked. She does not have any smokeless tobacco history on file. She reports that she does not drink alcohol or use drugs.  History reviewed. No pertinent family history.  The following portions of the patient's history were reviewed and updated as appropriate: allergies, current medications, past family history, past medical history, past social history, past surgical history and problem list.  Review of Systems Pertinent items noted in HPI and remainder of comprehensive ROS otherwise negative.  Physical Exam:  BP 107/70   Pulse 65   Wt 148 lb (67.1 kg)   BMI 29.89 kg/m  CONSTITUTIONAL: Well-developed, well-nourished female in no acute distress.  HENT:  Normocephalic, atraumatic, External right and left ear normal. Oropharynx is clear and moist EYES: Conjunctivae and EOM are normal. Pupils are equal, round, and reactive to light. No scleral icterus.  NECK: Normal range of motion, supple, no masses.  Normal  thyroid.  SKIN: Skin is warm and dry. No rash noted. Not diaphoretic. No erythema. No pallor. MUSCULOSKELETAL: Normal range of motion. No tenderness.  No cyanosis, clubbing, or edema.  2+ distal pulses. NEUROLOGIC: Alert and oriented to person, place, and time. Normal reflexes, muscle tone coordination.  PSYCHIATRIC: Normal mood and affect. Normal behavior. Normal judgment and thought content. CARDIOVASCULAR: Normal heart rate noted, regular rhythm RESPIRATORY: Clear to auscultation bilaterally. Effort and breath sounds normal, no problems with respiration noted. BREASTS: Symmetric in size. No masses, tenderness, skin changes, nipple drainage, or lymphadenopathy bilaterally. Performed in the presence of a chaperone. ABDOMEN: Soft, no distention noted.  No tenderness, rebound or guarding.  PELVIC: Normal appearing external genitalia and urethral meatus; normal appearing vaginal mucosa and cervix with mild ectropion.  No abnormal discharge noted.  Pap smear obtained.  Normal uterine size, no other palpable masses, no uterine or adnexal tenderness.  Performed in the presence of a chaperone.   Assessment and Plan:     Well woman exam with routine gynecological exam - Cytology - PAP Will follow up results of pap smear and manage accordingly. Satisfied with OCPs, no concerns. Routine preventative health maintenance measures emphasized. Please refer to After Visit Summary for other counseling recommendations.      Jaynie Collins, MD, FACOG Obstetrician & Gynecologist, [facility] [employer], [facility]

## 2020-01-21 LAB — CYTOLOGY - PAP
Comment: NEGATIVE
Diagnosis: NEGATIVE
High risk HPV: NEGATIVE

## 2020-03-05 ENCOUNTER — Ambulatory Visit (HOSPITAL_COMMUNITY)
Admission: EM | Admit: 2020-03-05 | Discharge: 2020-03-05 | Disposition: A | Discharge status: Home / Self Care | Payer: BC Managed Care – PPO | Attending: Family Medicine | Admitting: Family Medicine

## 2020-03-05 ENCOUNTER — Other Ambulatory Visit: Payer: Self-pay

## 2020-03-05 ENCOUNTER — Encounter (HOSPITAL_COMMUNITY): Payer: Self-pay

## 2020-03-05 DIAGNOSIS — R300 Dysuria: Secondary | ICD-10-CM | POA: Diagnosis not present

## 2020-03-05 DIAGNOSIS — N3090 Cystitis, unspecified without hematuria: Secondary | ICD-10-CM | POA: Diagnosis not present

## 2020-03-05 DIAGNOSIS — Z3202 Encounter for pregnancy test, result negative: Secondary | ICD-10-CM

## 2020-03-05 LAB — POCT URINALYSIS DIP (DEVICE)
Bilirubin Urine: NEGATIVE
Glucose, UA: NEGATIVE mg/dL
Ketones, ur: NEGATIVE mg/dL
Nitrite: NEGATIVE
Protein, ur: 30 mg/dL — AB
Specific Gravity, Urine: 1.015 (ref 1.005–1.030)
Urobilinogen, UA: 0.2 mg/dL (ref 0.0–1.0)
pH: 7 (ref 5.0–8.0)

## 2020-03-05 LAB — POC URINE PREG, ED: Preg Test, Ur: NEGATIVE

## 2020-03-05 MED ORDER — NITROFURANTOIN MONOHYD MACRO 100 MG PO CAPS
100.0000 mg | ORAL_CAPSULE | Freq: Two times a day (BID) | ORAL | 0 refills | Status: DC
Start: 2020-03-05 — End: 2020-09-08

## 2020-03-05 NOTE — ED Provider Notes (Signed)
[facility]    CSN: [id number] Arrival date & time: 03/05/20  1058      History   Chief Complaint Chief Complaint  Patient presents with  . Dysuria    HPI Joyce Nichols is a 28 y.o. female.   HPI  Patient is here for urinary tract infection States she is never had a urinary tract infection before Has dysuria and frequency today No fever or chills No nausea vomiting  no vaginal discharge  She states she has been having sexual relations for 1 month with no protection.  Wonders when she is going to get pregnant.  Is told that it could take a year once she stops her birth control pills.  Advised to follow-up with OB/GYN. She is seen with Burmese interpreter Crystal  History reviewed. No pertinent past medical history.  Patient Active Problem List   Diagnosis Date Noted  . Low back pain 06/24/2015  . Asthma, mild intermittent 01/25/2015    History reviewed. No pertinent surgical history.  OB History   No obstetric history on file.      Home Medications    Prior to Admission medications   Medication Sig Start Date End Date Taking? Authorizing Provider  nitrofurantoin, macrocrystal-monohydrate, (MACROBID) 100 MG capsule Take 1 capsule (100 mg total) by mouth 2 (two) times daily. 03/05/20   Eustace Moore, MD  norgestimate-ethinyl estradiol (ORTHO-CYCLEN) 0.25-35 MG-MCG tablet Take 1 tablet by mouth daily. 12/31/19 03/05/20  Calvert Cantor, CNM    Family History History reviewed. No pertinent family history.  Social History Social History   Tobacco Use  . Smoking status: Never Smoker  Substance Use Topics  . Alcohol use: No    Alcohol/week: 0.0 standard drinks  . Drug use: No     Allergies   Patient has no known allergies.   Review of Systems Review of Systems  Constitutional: Negative for fever.  Gastrointestinal: Negative for nausea and vomiting.  Genitourinary: Positive for dysuria, frequency and urgency. Negative for flank pain,  hematuria, menstrual problem, vaginal bleeding and vaginal discharge.     Physical Exam Triage Vital Signs ED Triage Vitals  Enc Vitals Group     BP 03/05/20 1139 106/79     Pulse Rate 03/05/20 1139 91     Resp 03/05/20 1139 20     Temp 03/05/20 1139 98.3 F (36.8 C)     Temp Source 03/05/20 1139 Oral     SpO2 03/05/20 1139 100 %     Weight --      Height --      Head Circumference --      Peak Flow --      Pain Score 03/05/20 1134 6     Pain Loc --      Pain Edu? --      Excl. in GC? --    No data found.  Updated Vital Signs BP 106/79 (BP Location: Right Arm)   Pulse 91   Temp 98.3 F (36.8 C) (Oral)   Resp 20   LMP 02/11/2020   SpO2 100%   Visual Acuity Right Eye Distance:   Left Eye Distance:   Bilateral Distance:    Right Eye Near:   Left Eye Near:    Bilateral Near:     Physical Exam Constitutional:      General: She is not in acute distress.    Appearance: She is well-developed and normal weight.  HENT:     Head: Normocephalic and atraumatic.  Mouth/Throat:     Comments: Mask is in place Eyes:     Conjunctiva/sclera: Conjunctivae normal.     Pupils: Pupils are equal, round, and reactive to light.  Cardiovascular:     Rate and Rhythm: Normal rate.  Pulmonary:     Effort: Pulmonary effort is normal. No respiratory distress.  Abdominal:     General: There is no distension.     Palpations: Abdomen is soft.     Tenderness: There is no right CVA tenderness or left CVA tenderness.  Musculoskeletal:        General: Normal range of motion.     Cervical back: Normal range of motion.  Skin:    General: Skin is warm and dry.  Neurological:     Mental Status: She is alert.  Psychiatric:        Mood and Affect: Mood normal.        Behavior: Behavior normal.      UC Treatments / Results  Labs (all labs ordered are listed, but only abnormal results are displayed) Labs Reviewed  POCT URINALYSIS DIP (DEVICE) - Abnormal; Notable for the following  components:      Result Value   Hgb urine dipstick MODERATE (*)    Protein, ur 30 (*)    Leukocytes,Ua LARGE (*)    All other components within normal limits  URINE CULTURE  POC URINE PREG, ED   Urine pregnancy is negative EKG   Radiology No results found.  Procedures Procedures (including critical care time)  Medications Ordered in UC Medications - No data to display  Initial Impression / Assessment and Plan / UC Course  I have reviewed the triage vital signs and the nursing notes.  Pertinent labs & imaging results that were available during my care of the patient were reviewed by me and considered in my medical decision making (see chart for details).     Reviewed that she has cystitis.  No evidence of pyelonephritis.  Patient is not pregnant. Final Clinical Impressions(s) / UC Diagnoses   Final diagnoses:  Cystitis     Discharge Instructions     Drink plenty of water Take the antibiotic 2 x a day Call OB/GYN if you have trouble with pregnancy   ED Prescriptions    Medication Sig Dispense Auth. Provider   nitrofurantoin, macrocrystal-monohydrate, (MACROBID) 100 MG capsule Take 1 capsule (100 mg total) by mouth 2 (two) times daily. 10 capsule Raylene Everts, MD     PDMP not reviewed this encounter.   Raylene Everts, MD 03/05/20 1324

## 2020-03-05 NOTE — ED Triage Notes (Signed)
Translation services used. Pt c/o painful urination, hesitancy, urgency since yesterday. Also reports abdominal pain. Denies fever, chills, n/v/d.

## 2020-03-05 NOTE — Discharge Instructions (Signed)
Drink plenty of water Take the antibiotic 2 x a day Call OB/GYN if you have trouble with pregnancy

## 2020-03-07 LAB — URINE CULTURE: Culture: 70000 — AB

## 2020-09-08 ENCOUNTER — Encounter (HOSPITAL_COMMUNITY): Payer: Self-pay | Admitting: Emergency Medicine

## 2020-09-08 ENCOUNTER — Ambulatory Visit (HOSPITAL_COMMUNITY)
Admission: EM | Admit: 2020-09-08 | Discharge: 2020-09-08 | Disposition: A | Discharge status: Home / Self Care | Payer: BC Managed Care – PPO | Attending: Internal Medicine | Admitting: Internal Medicine

## 2020-09-08 ENCOUNTER — Other Ambulatory Visit: Payer: Self-pay

## 2020-09-08 DIAGNOSIS — M5431 Sciatica, right side: Secondary | ICD-10-CM | POA: Insufficient documentation

## 2020-09-08 LAB — POCT URINALYSIS DIPSTICK, ED / UC
Bilirubin Urine: NEGATIVE
Glucose, UA: NEGATIVE mg/dL
Ketones, ur: NEGATIVE mg/dL
Leukocytes,Ua: NEGATIVE
Nitrite: NEGATIVE
Protein, ur: NEGATIVE mg/dL
Specific Gravity, Urine: 1.015 (ref 1.005–1.030)
Urobilinogen, UA: 0.2 mg/dL (ref 0.0–1.0)
pH: 7 (ref 5.0–8.0)

## 2020-09-08 MED ORDER — METAXALONE 800 MG PO TABS
800.0000 mg | ORAL_TABLET | Freq: Three times a day (TID) | ORAL | 0 refills | Status: DC
Start: 2020-09-08 — End: 2021-04-20

## 2020-09-08 MED ORDER — METAXALONE 800 MG PO TABS: 800.0000 mg | ORAL_TABLET | Freq: Three times a day (TID) | ORAL | Status: DC

## 2020-09-08 NOTE — ED Triage Notes (Signed)
Pt presents with low back pain and dysuria xs 2-3 weeks.

## 2020-09-08 NOTE — Discharge Instructions (Addendum)
Take the muscle relaxer (Skelaxin) as needed for back pain. You can take this medication up to 3 times daily.   For your urinary symptoms, I sent your urine to be tested for bacteria. If there is bacteria, we will call you and I will prescribe an antibiotic.

## 2020-09-08 NOTE — ED Provider Notes (Signed)
[facility]    CSN: [id number] Arrival date & time: 09/08/20  1610      History   Chief Complaint Chief Complaint  Patient presents with  . Back Pain    HPI Joyce Nichols is a 29 y.o. female presenting with low back pain and dysuria for 2-3 weeks.   Describes back pain as lower back and right-sided, extending down her right leg. It's uncomfortable to sit or stand for long periods of time. She's had similar pain for years, but it's been worse for the last 2-3 weeks. Denies trauma, changes in routine, etc. Has been taking ibuprofen with no improvement.   Also notes urinary symptoms. Pt has been treated for UTI in the past, last treated 03/2020, and states that her urinary symptoms are similar to this. Endorses dyrusia and frequency. Denies fevers/chills, discharge, abnormal smells, etc. She states that her last period was 08/28/2020 and she cannot be pregnant. Denies history of kidney stone.   HPI  History reviewed. No pertinent past medical history.  Patient Active Problem List   Diagnosis Date Noted  . Low back pain 06/24/2015  . Asthma, mild intermittent 01/25/2015    History reviewed. No pertinent surgical history.  OB History   No obstetric history on file.      Home Medications    Prior to Admission medications   Medication Sig Start Date End Date Taking? Authorizing Provider  nitrofurantoin, macrocrystal-monohydrate, (MACROBID) 100 MG capsule Take 1 capsule (100 mg total) by mouth 2 (two) times daily. 03/05/20   Eustace Moore, MD  norgestimate-ethinyl estradiol (ORTHO-CYCLEN) 0.25-35 MG-MCG tablet Take 1 tablet by mouth daily. 12/31/19 03/05/20  Calvert Cantor, CNM    Family History History reviewed. No pertinent family history.  Social History Social History   Tobacco Use  . Smoking status: Never Smoker  . Smokeless tobacco: Never Used  Substance Use Topics  . Alcohol use: No    Alcohol/week: 0.0 standard drinks  . Drug use: No      Allergies   Patient has no known allergies.   Review of Systems Review of Systems  Constitutional: Negative for chills and fever.  Gastrointestinal: Negative for abdominal distention, abdominal pain, constipation, diarrhea and nausea.  Genitourinary: Positive for flank pain, frequency and urgency. Negative for hematuria and pelvic pain.  Musculoskeletal: Positive for back pain (lower right back, extending down right leg).  All other systems reviewed and are negative.    Physical Exam Triage Vital Signs ED Triage Vitals  Enc Vitals Group     BP 09/08/20 1717 103/72     Pulse Rate 09/08/20 1717 62     Resp 09/08/20 1717 17     Temp 09/08/20 1717 98.3 F (36.8 C)     Temp Source 09/08/20 1717 Oral     SpO2 09/08/20 1717 100 %     Weight --      Height --      Head Circumference --      Peak Flow --      Pain Score 09/08/20 1714 10     Pain Loc --      Pain Edu? --      Excl. in GC? --    No data found.  Updated Vital Signs BP 103/72 (BP Location: Right Arm)   Pulse 62   Temp 98.3 F (36.8 C) (Oral)   Resp 17   LMP 08/17/2020   SpO2 100%   Visual Acuity Right Eye Distance:  Left Eye Distance:   Bilateral Distance:    Right Eye Near:   Left Eye Near:    Bilateral Near:     Physical Exam Vitals reviewed.  Constitutional:      General: She is not in acute distress.    Appearance: Normal appearance.  HENT:     Head: Normocephalic and atraumatic.  Cardiovascular:     Rate and Rhythm: Normal rate and regular rhythm.     Heart sounds: Normal heart sounds.  Pulmonary:     Effort: Pulmonary effort is normal.     Breath sounds: Normal breath sounds.  Abdominal:     General: Bowel sounds are normal.     Palpations: Abdomen is soft.     Tenderness: There is no abdominal tenderness. There is no right CVA tenderness, left CVA tenderness, guarding or rebound.  Musculoskeletal:     Cervical back: No swelling, deformity or tenderness.     Thoracic back: No  swelling, deformity or tenderness.     Lumbar back: Tenderness present. No swelling, deformity or bony tenderness. Positive right straight leg raise test. Negative left straight leg raise test.     Comments: R lumbar Paraspinous muscle tenderness to palpation. Positive R straight leg raise.   Neurological:     Mental Status: She is alert.      UC Treatments / Results  Labs (all labs ordered are listed, but only abnormal results are displayed) Labs Reviewed - No data to display  EKG   Radiology No results found.  Procedures Procedures (including critical care time)  Medications Ordered in UC Medications - No data to display  Initial Impression / Assessment and Plan / UC Course  I have reviewed the triage vital signs and the nursing notes.  Pertinent labs & imaging results that were available during my care of the patient were reviewed by me and considered in my medical decision making (see chart for details).  Clinical Course as of Sep 08 1725  Wed Sep 08, 2020  1724 POCT Urinalysis, Dipstick [LG]    Clinical Course User Index [LG] Rhys Martini, PA-C    UA today showing trace Hbg but otherwise wnl. I attribute lumbar pain to sciatica, but given history of UTI in the past, Culture sent to completely rule this out. Patient is in agreement with this treatment plan.   For back pain, no red flag symptoms (bowel/bladder incontinence, saddle anesthesia, etc). Script for skelaxin sent as below. Also rec ibuprofen prn for pain.   Final Clinical Impressions(s) / UC Diagnoses   Final diagnoses:  None   Discharge Instructions   None    ED Prescriptions    None     PDMP not reviewed this encounter.   Rhys Martini, PA-C 09/08/20 1846

## 2020-09-10 LAB — URINE CULTURE: Culture: NO GROWTH

## 2021-01-12 ENCOUNTER — Other Ambulatory Visit: Payer: Self-pay

## 2021-01-12 ENCOUNTER — Ambulatory Visit (HOSPITAL_COMMUNITY)
Admission: EM | Admit: 2021-01-12 | Discharge: 2021-01-12 | Disposition: A | Discharge status: Home / Self Care | Payer: BC Managed Care – PPO | Attending: Family Medicine | Admitting: Family Medicine

## 2021-01-12 ENCOUNTER — Encounter (HOSPITAL_COMMUNITY): Payer: Self-pay | Admitting: Emergency Medicine

## 2021-01-12 DIAGNOSIS — M5432 Sciatica, left side: Secondary | ICD-10-CM | POA: Diagnosis not present

## 2021-01-12 LAB — POCT URINALYSIS DIPSTICK, ED / UC
Bilirubin Urine: NEGATIVE
Glucose, UA: NEGATIVE mg/dL
Ketones, ur: NEGATIVE mg/dL
Leukocytes,Ua: NEGATIVE
Nitrite: NEGATIVE
Protein, ur: NEGATIVE mg/dL
Specific Gravity, Urine: 1.025 (ref 1.005–1.030)
Urobilinogen, UA: 0.2 mg/dL (ref 0.0–1.0)
pH: 6.5 (ref 5.0–8.0)

## 2021-01-12 LAB — POC URINE PREG, ED: Preg Test, Ur: NEGATIVE

## 2021-01-12 MED ORDER — PREDNISONE 20 MG PO TABS
40.0000 mg | ORAL_TABLET | Freq: Every day | ORAL | 0 refills | Status: DC
Start: 2021-01-12 — End: 2021-04-20

## 2021-01-12 NOTE — ED Triage Notes (Signed)
Pt presents with left leg pain xs 2-3 months. States also has pain in right leg but not as severe. States pain got worse yesterday. Denies any fall or injury.

## 2021-01-12 NOTE — ED Provider Notes (Signed)
[facility]    CSN: [id number] Arrival date & time: 01/12/21  1609      History   Chief Complaint Chief Complaint  Patient presents with  . Leg Pain     HPI Joyce Nichols is a 30 y.o. female.   Patient presenting today with her daughter who she requested to be her translator today.  She declines a Orthoptist today.  Patient presenting today with 2 to 3 months of ongoing bilateral posterior buttock pain running down to the back of the legs.  Was seen 3 months ago for worse pain on the right, still pain on that side but now the pain is more so on the left.  She states the pain got worse yesterday without known trigger or injury.  Denies numbness, tingling, leg weakness, bowel or bladder incontinence, saddle paresthesias, fever, chills.  Took muscle relaxers for previous flare without benefit.  Has not been taking anything over-the-counter since.    History reviewed. No pertinent past medical history.  Patient Active Problem List   Diagnosis Date Noted  . Low back pain 06/24/2015  . Asthma, mild intermittent 01/25/2015    History reviewed. No pertinent surgical history.  OB History   No obstetric history on file.      Home Medications    Prior to Admission medications   Medication Sig Start Date End Date Taking? Authorizing Provider  predniSONE (DELTASONE) 20 MG tablet Take 2 tablets (40 mg total) by mouth daily with breakfast. 01/12/21  Yes Particia Nearing, PA-C  metaxalone (SKELAXIN) 800 MG tablet Take 1 tablet (800 mg total) by mouth 3 (three) times daily. 09/08/20   Rhys Martini, PA-C  norgestimate-ethinyl estradiol (ORTHO-CYCLEN) 0.25-35 MG-MCG tablet Take 1 tablet by mouth daily. 12/31/19 03/05/20  Calvert Cantor, CNM    Family History History reviewed. No pertinent family history.  Social History Social History   Tobacco Use  . Smoking status: Never Smoker  . Smokeless tobacco: Never Used  Substance Use Topics  . Alcohol use: No     Alcohol/week: 0.0 standard drinks  . Drug use: No     Allergies   Patient has no known allergies.   Review of Systems Review of Systems Per HPI  Physical Exam Triage Vital Signs ED Triage Vitals  Enc Vitals Group     BP 01/12/21 1653 107/65     Pulse Rate 01/12/21 1653 63     Resp 01/12/21 1653 16     Temp 01/12/21 1653 98.3 F (36.8 C)     Temp Source 01/12/21 1653 Oral     SpO2 01/12/21 1653 100 %     Weight --      Height --      Head Circumference --      Peak Flow --      Pain Score 01/12/21 1652 7     Pain Loc --      Pain Edu? --      Excl. in GC? --    No data found.  Updated Vital Signs BP 107/65 (BP Location: Right Arm)   Pulse 63   Temp 98.3 F (36.8 C) (Oral)   Resp 16   LMP  (LMP Unknown)   SpO2 100%   Visual Acuity Right Eye Distance:   Left Eye Distance:   Bilateral Distance:    Right Eye Near:   Left Eye Near:    Bilateral Near:     Physical Exam Vitals and nursing note  reviewed.  Constitutional:      Appearance: Normal appearance. She is not ill-appearing.  HENT:     Head: Atraumatic.  Eyes:     Extraocular Movements: Extraocular movements intact.     Conjunctiva/sclera: Conjunctivae normal.  Cardiovascular:     Rate and Rhythm: Normal rate and regular rhythm.     Heart sounds: Normal heart sounds.  Pulmonary:     Effort: Pulmonary effort is normal.     Breath sounds: Normal breath sounds.  Musculoskeletal:        General: Tenderness present. No swelling, deformity or signs of injury. Normal range of motion.     Cervical back: Normal range of motion and neck supple.     Comments: Tender palpation bilateral posterior buttock extending down to posterior upper legs Negative straight leg raise bilaterally Gait antalgic but intact  Skin:    General: Skin is warm and dry.     Findings: No bruising or erythema.  Neurological:     Mental Status: She is alert and oriented to person, place, and time.     Comments: Bilateral  lower extremities neurovascularly intact  Psychiatric:        Mood and Affect: Mood normal.        Thought Content: Thought content normal.        Judgment: Judgment normal.     UC Treatments / Results  Labs (all labs ordered are listed, but only abnormal results are displayed) Labs Reviewed  POCT URINALYSIS DIPSTICK, ED / UC - Abnormal; Notable for the following components:      Result Value   Hgb urine dipstick TRACE (*)    All other components within normal limits  POC URINE PREG, ED    EKG   Radiology No results found.  Procedures Procedures (including critical care time)  Medications Ordered in UC Medications - No data to display  Initial Impression / Assessment and Plan / UC Course  I have reviewed the triage vital signs and the nursing notes.  Pertinent labs & imaging results that were available during my care of the patient were reviewed by me and considered in my medical decision making (see chart for details).     Vital signs within normal limits, UA reassuring other than trace hemoglobin.  Suspect muscular, sciatic nerve irritation causing her pain symptoms.  Will treat with prednisone burst, over-the-counter pain relievers, stretches and rehab exercises.  Discussed need for primary care provider and gave information for to establish care.  Return for acutely worsening symptoms in the meantime.  Final Clinical Impressions(s) / UC Diagnoses   Final diagnoses:  Sciatica of left side     Discharge Instructions     Take ibuprofen and Tylenol as needed for pain additionally    ED Prescriptions    Medication Sig Dispense Auth. Provider   predniSONE (DELTASONE) 20 MG tablet Take 2 tablets (40 mg total) by mouth daily with breakfast. 10 tablet Particia Nearing, [place]     PDMP not reviewed this encounter.   Particia Nearing, [place] 01/12/21 1932

## 2021-01-12 NOTE — Discharge Instructions (Signed)
Take ibuprofen and Tylenol as needed for pain additionally

## 2021-04-20 ENCOUNTER — Ambulatory Visit (HOSPITAL_COMMUNITY)
Admission: EM | Admit: 2021-04-20 | Discharge: 2021-04-20 | Disposition: A | Discharge status: Home / Self Care | Payer: BC Managed Care – PPO | Attending: Nurse Practitioner | Admitting: Nurse Practitioner

## 2021-04-20 ENCOUNTER — Other Ambulatory Visit: Payer: Self-pay

## 2021-04-20 ENCOUNTER — Encounter (HOSPITAL_COMMUNITY): Payer: Self-pay | Admitting: *Deleted

## 2021-04-20 DIAGNOSIS — M5431 Sciatica, right side: Secondary | ICD-10-CM

## 2021-04-20 MED ORDER — PREDNISONE 10 MG (21) PO TBPK: ORAL_TABLET | Freq: Every day | ORAL | 0 refills | Status: AC

## 2021-04-20 MED ORDER — MELOXICAM 15 MG PO TABS: 15.0000 mg | ORAL_TABLET | Freq: Every day | ORAL | 0 refills | Status: AC

## 2021-04-20 NOTE — ED Provider Notes (Signed)
[facility]    CSN: [id number] Arrival date & time: 04/20/21  1418      History   Chief Complaint Chief Complaint  Patient presents with   Back Pain    HPI Joyce Nichols is a 30 y.o. female.   Subjective:  Joyce Nichols is a 30 y.o. female who presents for evaluation of low back pain. The patient has had recurrent self limited episodes of low back pain in the past. Symptoms have been present for 3 weeks and are unchanged.  Onset was related to / precipitated by no known injury. The pain is located in the right lumbar area and radiates to the buttock and right lower leg. The pain is described as aching, sharp, and tingling and occurs intermittently but frequently. She rates her pain as a 10 on a scale of 0-10. Symptoms are exacerbated by extension and flexion. Symptoms are improved by nothing. She has also tried acetaminophen which provided no symptom relief. She denies any leg weakness, burning pain in the leg, urinary/bowel incontinence, dysuria, hematuria  or groin/perineal numbness associated with the back pain. The patient has no "red flag" history indicative of complicated back pain.  The following portions of the patient's history were reviewed and updated as appropriate: allergies, current medications, past family history, past medical history, past social history, past surgical history, and problem list.      History reviewed. No pertinent past medical history.  Patient Active Problem List   Diagnosis Date Noted   Low back pain 06/24/2015   Asthma, mild intermittent 01/25/2015    History reviewed. No pertinent surgical history.  OB History   No obstetric history on file.      Home Medications    Prior to Admission medications   Medication Sig Start Date End Date Taking? Authorizing Provider  meloxicam (MOBIC) 15 MG tablet Take 1 tablet (15 mg total) by mouth daily for 14 days. 04/20/21 05/04/21 Yes Lurline Idol, FNP  predniSONE (STERAPRED UNI-PAK 21 TAB)  10 MG (21) TBPK tablet Take by mouth daily. Take 6 tabs by mouth daily  for 2 days, then 5 tabs for 2 days, then 4 tabs for 2 days, then 3 tabs for 2 days, 2 tabs for 2 days, then 1 tab by mouth daily for 2 days 04/20/21  Yes Lurline Idol, FNP  norgestimate-ethinyl estradiol (ORTHO-CYCLEN) 0.25-35 MG-MCG tablet Take 1 tablet by mouth daily. 12/31/19 03/05/20  Calvert Cantor, CNM    Family History History reviewed. No pertinent family history.  Social History Social History   Tobacco Use   Smoking status: Never   Smokeless tobacco: Never  Substance Use Topics   Alcohol use: No    Alcohol/week: 0.0 standard drinks   Drug use: No     Allergies   Patient has no known allergies.   Review of Systems Review of Systems  Musculoskeletal:  Positive for back pain.  Neurological:  Negative for weakness.  All other systems reviewed and are negative.   Physical Exam Triage Vital Signs ED Triage Vitals  Enc Vitals Group     BP 04/20/21 1456 101/61     Pulse Rate 04/20/21 1456 77     Resp 04/20/21 1456 20     Temp 04/20/21 1456 98.7 F (37.1 C)     Temp src --      SpO2 04/20/21 1456 99 %     Weight --      Height --      Head  Circumference --      Peak Flow --      Pain Score 04/20/21 1457 10     Pain Loc --      Pain Edu? --      Excl. in GC? --    No data found.  Updated Vital Signs BP 101/61   Pulse 77   Temp 98.7 F (37.1 C)   Resp 20   SpO2 99%   Visual Acuity Right Eye Distance:   Left Eye Distance:   Bilateral Distance:    Right Eye Near:   Left Eye Near:    Bilateral Near:     Physical Exam Vitals reviewed.  Constitutional:      Appearance: Normal appearance.  HENT:     Head: Normocephalic.  Cardiovascular:     Rate and Rhythm: Normal rate.  Pulmonary:     Effort: Pulmonary effort is normal.  Musculoskeletal:     Cervical back: Normal, normal range of motion and neck supple.     Thoracic back: Normal.     Lumbar back: Tenderness  present. No swelling, edema, deformity, spasms or bony tenderness. Normal range of motion. Positive right straight leg raise test.  Skin:    General: Skin is warm and dry.  Neurological:     General: No focal deficit present.     Mental Status: She is alert and oriented to person, place, and time.  Psychiatric:        Mood and Affect: Mood normal.        Behavior: Behavior normal.     UC Treatments / Results  Labs (all labs ordered are listed, but only abnormal results are displayed) Labs Reviewed - No data to display  EKG   Radiology No results found.  Procedures Procedures (including critical care time)  Medications Ordered in UC Medications - No data to display  Initial Impression / Assessment and Plan / UC Course  I have reviewed the triage vital signs and the nursing notes.  Pertinent labs & imaging results that were available during my care of the patient were reviewed by me and considered in my medical decision making (see chart for details).    30 yo female with history of sciatica/low back pain presents with right lumbar pain with radiation down the buttocks and right lower leg. Current symptoms been present for the past 3 weeks. She's tried tylenol with no relief in her symptoms. She has no red flag history indicative of complicated back pain.    Plan:  Proper lifting, bending technique discussed. Heat to affected area as needed for local pain relief. NSAIDs per medication orders. Steroids per medication orders  Orthopedic referral, information provided, patient should call herself to arrange follow-up   Today's evaluation has revealed no signs of a dangerous process. Discussed diagnosis with patient and/or guardian. Patient and/or guardian aware of their diagnosis, possible red flag symptoms to watch out for and need for close follow up. Patient and/or guardian understands verbal and written discharge instructions. Patient and/or guardian comfortable with plan  and disposition.  Patient and/or guardian has a clear mental status at this time, good insight into illness (after discussion and teaching) and has clear judgment to make decisions regarding their care  This care was provided during an unprecedented National Emergency due to the Novel Coronavirus (COVID-19) pandemic. COVID-19 infections and transmission risks place heavy strains on healthcare resources.  As this pandemic evolves, our facility, providers, and staff strive to respond fluidly, to remain operational,  and to provide care relative to available resources and information. Outcomes are unpredictable and treatments are without well-defined guidelines. Further, the impact of COVID-19 on all aspects of urgent care, including the impact to patients seeking care for reasons other than COVID-19, is unavoidable during this national emergency. At this time of the global pandemic, management of patients has significantly changed, even for non-COVID positive patients given high local and regional COVID volumes at this time requiring high healthcare system and resource utilization. The standard of care for management of both COVID suspected and non-COVID suspected patients continues to change rapidly at the local, regional, national, and global levels. This patient was worked up and treated to the best available but ever changing evidence and resources available at this current time.   Documentation was completed with the aid of voice recognition software. Transcription may contain typographical errors. Final Clinical Impressions(s) / UC Diagnoses   Final diagnoses:  Sciatica of right side   Discharge Instructions   None    ED Prescriptions     Medication Sig Dispense Auth. Provider   predniSONE (STERAPRED UNI-PAK 21 TAB) 10 MG (21) TBPK tablet Take by mouth daily. Take 6 tabs by mouth daily  for 2 days, then 5 tabs for 2 days, then 4 tabs for 2 days, then 3 tabs for 2 days, 2 tabs for 2 days, then 1  tab by mouth daily for 2 days 42 tablet Tonnia Bardin, Lelon Mast, FNP   meloxicam (MOBIC) 15 MG tablet Take 1 tablet (15 mg total) by mouth daily for 14 days. 14 tablet Lurline Idol, FNP      PDMP not reviewed this encounter.   Lurline Idol, [place] 04/20/21 [phone]

## 2021-04-20 NOTE — ED Triage Notes (Signed)
Pt reports lower back pain.
